# Patient Record
Sex: Male | Born: 1965 | Race: White | Hispanic: No | Smoking: Current every day smoker
Health system: Southern US, Community
[De-identification: ages and names within clinical notes are randomized; demographics above are authoritative.]

## PROBLEM LIST (undated history)

## (undated) HISTORY — PX: TONSILLECTOMY: SUR1361

## (undated) HISTORY — PX: ABDOMINAL SURGERY: SHX537

---

## 2013-09-18 ENCOUNTER — Encounter (HOSPITAL_COMMUNITY): Payer: Self-pay | Admitting: Emergency Medicine

## 2013-09-18 ENCOUNTER — Emergency Department (HOSPITAL_COMMUNITY)
Admission: EM | Admit: 2013-09-18 | Discharge: 2013-09-18 | Disposition: A | Discharge status: Home / Self Care | Payer: Self-pay | Attending: Emergency Medicine | Admitting: Emergency Medicine

## 2013-09-18 DIAGNOSIS — Z792 Long term (current) use of antibiotics: Secondary | ICD-10-CM | POA: Insufficient documentation

## 2013-09-18 DIAGNOSIS — K029 Dental caries, unspecified: Secondary | ICD-10-CM | POA: Insufficient documentation

## 2013-09-18 DIAGNOSIS — F17200 Nicotine dependence, unspecified, uncomplicated: Secondary | ICD-10-CM | POA: Insufficient documentation

## 2013-09-18 MED ORDER — OXYCODONE-ACETAMINOPHEN 5-325 MG PO TABS
1.0000 | ORAL_TABLET | Freq: Once | ORAL | Status: AC
  Administered 2013-09-18: 1 via ORAL
  Filled 2013-09-18: qty 1

## 2013-09-18 MED ORDER — CLINDAMYCIN HCL 300 MG PO CAPS: 300.0000 mg | ORAL_CAPSULE | Freq: Three times a day (TID) | ORAL | Status: DC

## 2013-09-18 MED ORDER — CLINDAMYCIN HCL 150 MG PO CAPS: 300.0000 mg | ORAL_CAPSULE | Freq: Three times a day (TID) | ORAL | Status: DC

## 2013-09-18 MED ORDER — HYDROCODONE-ACETAMINOPHEN 5-325 MG PO TABS
1.0000 | ORAL_TABLET | ORAL | Status: DC | PRN
Start: 2013-09-18 — End: 2014-08-24

## 2013-09-18 MED ORDER — CLINDAMYCIN PHOSPHATE 600 MG/50ML IV SOLN
600.0000 mg | Freq: Once | INTRAVENOUS | Status: AC
  Administered 2013-09-18: 600 mg via INTRAVENOUS
  Filled 2013-09-18: qty 50

## 2013-09-18 NOTE — ED Notes (Signed)
Pt from home reports tooth pain x4 days. Pt states that he woke up this am with jaw swelling and pain to the back of his throat. Pt is A&O and in NAD
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
## 2013-09-18 NOTE — Progress Notes (Signed)
Received an incoming call from [facility] re Patients clindamycin prescription.Pharmacists requesting if medication dose can be changed from 300 mg to 150 mg as this will be cheaper for  The Patient.Ths Case Manager spoke with Pricilla Loveless MD. New order received from Dr Criss Alvine  For Clindamycin 2 capsules- 300mg - three times daily.Pharmacist christina accepted  Order.No further case manager needs identified.

## 2014-08-24 ENCOUNTER — Emergency Department (HOSPITAL_COMMUNITY): Payer: 59

## 2014-08-24 ENCOUNTER — Emergency Department (HOSPITAL_COMMUNITY)
Admission: EM | Admit: 2014-08-24 | Discharge: 2014-08-24 | Disposition: A | Discharge status: Home / Self Care | Payer: 59 | Attending: Emergency Medicine | Admitting: Emergency Medicine

## 2014-08-24 ENCOUNTER — Encounter (HOSPITAL_COMMUNITY): Payer: Self-pay | Admitting: Emergency Medicine

## 2014-08-24 DIAGNOSIS — R1011 Right upper quadrant pain: Secondary | ICD-10-CM | POA: Diagnosis present

## 2014-08-24 DIAGNOSIS — Z9889 Other specified postprocedural states: Secondary | ICD-10-CM | POA: Insufficient documentation

## 2014-08-24 DIAGNOSIS — Z720 Tobacco use: Secondary | ICD-10-CM | POA: Diagnosis not present

## 2014-08-24 DIAGNOSIS — R0602 Shortness of breath: Secondary | ICD-10-CM | POA: Diagnosis not present

## 2014-08-24 DIAGNOSIS — K219 Gastro-esophageal reflux disease without esophagitis: Secondary | ICD-10-CM | POA: Diagnosis not present

## 2014-08-24 DIAGNOSIS — R079 Chest pain, unspecified: Secondary | ICD-10-CM | POA: Insufficient documentation

## 2014-08-24 DIAGNOSIS — R1010 Upper abdominal pain, unspecified: Secondary | ICD-10-CM

## 2014-08-24 LAB — CBC WITH DIFFERENTIAL/PLATELET
BASOS PCT: 0 % (ref 0–1)
Basophils Absolute: 0 10*3/uL (ref 0.0–0.1)
EOS ABS: 0.2 10*3/uL (ref 0.0–0.7)
Eosinophils Relative: 3 % (ref 0–5)
HCT: 42.1 % (ref 39.0–52.0)
Hemoglobin: 14.9 g/dL (ref 13.0–17.0)
Lymphocytes Relative: 21 % (ref 12–46)
Lymphs Abs: 1.3 10*3/uL (ref 0.7–4.0)
MCH: 31 pg (ref 26.0–34.0)
MCHC: 35.4 g/dL (ref 30.0–36.0)
MCV: 87.5 fL (ref 78.0–100.0)
Monocytes Absolute: 0.6 10*3/uL (ref 0.1–1.0)
Monocytes Relative: 9 % (ref 3–12)
NEUTROS ABS: 4.3 10*3/uL (ref 1.7–7.7)
Neutrophils Relative %: 67 % (ref 43–77)
PLATELETS: 200 10*3/uL (ref 150–400)
RBC: 4.81 MIL/uL (ref 4.22–5.81)
RDW: 13.4 % (ref 11.5–15.5)
WBC: 6.5 10*3/uL (ref 4.0–10.5)

## 2014-08-24 LAB — COMPREHENSIVE METABOLIC PANEL
ALBUMIN: 3.8 g/dL (ref 3.5–5.2)
ALK PHOS: 59 U/L (ref 39–117)
ALT: 14 U/L (ref 0–53)
AST: 23 U/L (ref 0–37)
Anion gap: 13 (ref 5–15)
BUN: 17 mg/dL (ref 6–23)
CHLORIDE: 99 meq/L (ref 96–112)
CO2: 25 mEq/L (ref 19–32)
Calcium: 9.5 mg/dL (ref 8.4–10.5)
Creatinine, Ser: 0.84 mg/dL (ref 0.50–1.35)
GFR calc Af Amer: >90 mL/min (ref >90)
GFR calc non Af Amer: >90 mL/min (ref >90)
Glucose, Bld: 113 mg/dL — ABNORMAL HIGH (ref 70–99)
POTASSIUM: 4.5 meq/L (ref 3.7–5.3)
SODIUM: 137 meq/L (ref 137–147)
TOTAL PROTEIN: 7.1 g/dL (ref 6.0–8.3)
Total Bilirubin: 0.5 mg/dL (ref 0.3–1.2)

## 2014-08-24 LAB — URINALYSIS, ROUTINE W REFLEX MICROSCOPIC
Bilirubin Urine: NEGATIVE
GLUCOSE, UA: NEGATIVE mg/dL
HGB URINE DIPSTICK: NEGATIVE
Ketones, ur: NEGATIVE mg/dL
Leukocytes, UA: NEGATIVE
Nitrite: NEGATIVE
Protein, ur: NEGATIVE mg/dL
SPECIFIC GRAVITY, URINE: 1.02 (ref 1.005–1.030)
Urobilinogen, UA: 0.2 mg/dL (ref 0.0–1.0)
pH: 6 (ref 5.0–8.0)

## 2014-08-24 LAB — TROPONIN I: Troponin I: <0.3 ng/mL (ref <0.30)

## 2014-08-24 LAB — LIPASE, BLOOD: Lipase: 49 U/L (ref 11–59)

## 2014-08-24 MED ORDER — PANTOPRAZOLE SODIUM 40 MG IV SOLR
40.0000 mg | Freq: Once | INTRAVENOUS | Status: AC
  Administered 2014-08-24: 40 mg via INTRAVENOUS
  Filled 2014-08-24: qty 40

## 2014-08-24 MED ORDER — PANTOPRAZOLE SODIUM 20 MG PO TBEC: 20.0000 mg | DELAYED_RELEASE_TABLET | Freq: Every day | ORAL | Status: AC

## 2014-08-24 MED ORDER — SODIUM CHLORIDE 0.9 % IV BOLUS (SEPSIS)
1000.0000 mL | Freq: Once | INTRAVENOUS | Status: AC
  Administered 2014-08-24: 1000 mL via INTRAVENOUS
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
## 2014-08-24 NOTE — ED Notes (Signed)
Gave patient a diet coke and he handle it well

## 2014-08-24 NOTE — ED Notes (Signed)
Pt states he is having pain in his upper abdomen above his umbilicus that started last night about 2030  Pt has nausea without vomiting or diarrhea  Pt states the pain has subsided some but it very tender to palpate  Pt states the pain comes in waves

## 2014-08-24 NOTE — Discharge Instructions (Signed)
Please call your doctor for a followup appointment within 24-48 hours. When you talk to your doctor please let them know that you were seen in the emergency department and have them acquire all of your records so that they can discuss the findings with you and formulate a treatment plan to fully care for your new and ongoing problems. Cardiology is to call you at some point next week - please follow-up with them Please rest and stay hydrated Please avoid any Gastroesophageal Reflux Disease, Adult Gastroesophageal reflux disease (GERD) happens when acid from your stomach flows up into the esophagus. When acid comes in contact with the esophagus, the acid causes soreness (inflammation) in the esophagus. Over time, GERD may create small holes (ulcers) in the lining of the esophagus. CAUSES   Increased body weight. This puts pressure on the stomach, making acid rise from the stomach into the esophagus.  Smoking. This increases acid production in the stomach.  Drinking alcohol. This causes decreased pressure in the lower esophageal sphincter (valve or ring of muscle between the esophagus and stomach), allowing acid from the stomach into the esophagus.  Late evening meals and a full stomach. This increases pressure and acid production in the stomach.  A malformed lower esophageal sphincter. Sometimes, no cause is found. SYMPTOMS   Burning pain in the lower part of the mid-chest behind the breastbone and in the mid-stomach area. This may occur twice a week or more often.  Trouble swallowing.  Sore throat.  Dry cough.  Asthma-like symptoms including chest tightness, shortness of breath, or wheezing. DIAGNOSIS  Your caregiver may be able to diagnose GERD based on your symptoms. In some cases, X-rays and other tests may be done to check for complications or to check the condition of your stomach and esophagus. TREATMENT  Your caregiver may recommend over-the-counter or prescription medicines to  help decrease acid production. Ask your caregiver before starting or adding any new medicines.  HOME CARE INSTRUCTIONS   Change the factors that you can control. Ask your caregiver for guidance concerning weight loss, quitting smoking, and alcohol consumption.  Avoid foods and drinks that make your symptoms worse, such as:  Caffeine or alcoholic drinks.  Chocolate.  Peppermint or mint flavorings.  Garlic and onions.  Spicy foods.  Citrus fruits, such as oranges, lemons, or limes.  Tomato-based foods such as sauce, chili, salsa, and pizza.  Fried and fatty foods.  Avoid lying down for the 3 hours prior to your bedtime or prior to taking a nap.  Eat small, frequent meals instead of large meals.  Wear loose-fitting clothing. Do not wear anything tight around your waist that causes pressure on your stomach.  Raise the head of your bed 6 to 8 inches with wood blocks to help you sleep. Extra pillows will not help.  Only take over-the-counter or prescription medicines for pain, discomfort, or fever as directed by your caregiver.  Do not take aspirin, ibuprofen, or other nonsteroidal anti-inflammatory drugs (NSAIDs). SEEK IMMEDIATE MEDICAL CARE IF:   You have pain in your arms, neck, jaw, teeth, or back.  Your pain increases or changes in intensity or duration.  You develop nausea, vomiting, or sweating (diaphoresis).  You develop shortness of breath, or you faint.  Your vomit is green, yellow, black, or looks like coffee grounds or blood.  Your stool is red, bloody, or black. These symptoms could be signs of other problems, such as heart disease, gastric bleeding, or esophageal bleeding. MAKE SURE YOU:  Understand these instructions.  Will watch your condition.  Will get help right away if you are not doing well or get worse. Document Released: 08/14/2005 Document Revised: 01/27/2012 Document Reviewed: 05/24/2011 [facility] Patient Information 2015 [place], [place].  This information is not intended to replace advice given to you by your health care provider. Make sure you discuss any questions you have with your health care provider. physical or strenuous activity  Please rest and stay hydrated Please decrease alcohol, fatty foods, greasy foods, spicy foods Please take medications as prescribed Please continue to monitor symptoms closely and if symptoms are to worsen or change (fever greater than 101, chills, sweating, nausea, vomiting, chest pain, shortness of breathe, difficulty breathing, weakness, numbness, tingling, worsening or changes to pain pattern, blood in the stools, black tarry stools) please report back to the Emergency Department immediately.   Food Choices for Gastroesophageal Reflux Disease When you have gastroesophageal reflux disease (GERD), the foods you eat and your eating habits are very important. Choosing the right foods can help ease the discomfort of GERD. WHAT GENERAL GUIDELINES DO I NEED TO FOLLOW?  Choose fruits, vegetables, whole grains, low-fat dairy products, and low-fat meat, fish, and poultry.  Limit fats such as oils, salad dressings, butter, nuts, and avocado.  Keep a food diary to identify foods that cause symptoms.  Avoid foods that cause reflux. These may be different for different people.  Eat frequent small meals instead of three large meals each day.  Eat your meals slowly, in a relaxed setting.  Limit fried foods.  Cook foods using methods other than frying.  Avoid drinking alcohol.  Avoid drinking large amounts of liquids with your meals.  Avoid bending over or lying down until 2-3 hours after eating. WHAT FOODS ARE NOT RECOMMENDED? The following are some foods and drinks that may worsen your symptoms: Vegetables Tomatoes. Tomato juice. Tomato and spaghetti sauce. Chili peppers. Onion and garlic. Horseradish. Fruits Oranges, grapefruit, and lemon (fruit and juice). Meats High-fat meats, fish, and  poultry. This includes hot dogs, ribs, ham, sausage, salami, and bacon. Dairy Whole milk and chocolate milk. Sour cream. Cream. Butter. Ice cream. Cream cheese.  Beverages Coffee and tea, with or without caffeine. Carbonated beverages or energy drinks. Condiments Hot sauce. Barbecue sauce.  Sweets/Desserts Chocolate and cocoa. Donuts. Peppermint and spearmint. Fats and Oils High-fat foods, including [place] fries and potato chips. Other Vinegar. Strong spices, such as black pepper, white pepper, red pepper, cayenne, curry powder, cloves, ginger, and chili powder. The items listed above may not be a complete list of foods and beverages to avoid. Contact your dietitian for more information. Document Released: 11/04/2005 Document Revised: 11/09/2013 Document Reviewed: 09/08/2013 [facility] Patient Information 2015 [place], [place]. This information is not intended to replace advice given to you by your health care provider. Make sure you discuss any questions you have with your health care provider.   Emergency Department Resource Guide 1) Find a Doctor and Pay Out of Pocket Although you won't have to find out who is covered by your insurance plan, it is a good idea to ask around and get recommendations. You will then need to call the office and see if the doctor you have chosen will accept you as a new patient and what types of options they offer for patients who are self-pay. Some doctors offer discounts or will set up payment plans for their patients who do not have insurance, but you will need to ask so you aren't surprised when you  get to your appointment.  2) Contact Your Local Health Department Not all health departments have doctors that can see patients for sick visits, but many do, so it is worth a call to see if yours does. If you don't know where your local health department is, you can check in your phone book. The CDC also has a tool to help you locate your state's health department,  and many state websites also have listings of all of their local health departments.  3) Find a Walk-in Clinic If your illness is not likely to be very severe or complicated, you may want to try a walk in clinic. These are popping up all over the country in pharmacies, drugstores, and shopping centers. They're usually staffed by nurse practitioners or physician assistants that have been trained to treat common illnesses and complaints. They're usually fairly quick and inexpensive. However, if you have serious medical issues or chronic medical problems, these are probably not your best option.  No Primary Care Doctor: - Call Health Connect at  [phone] - they can help you locate a primary care doctor that  accepts your insurance, provides certain services, etc. - Physician Referral Service- [phone]  Chronic Pain Problems: Organization         Address  Phone   Notes  [facility]  [phone] Patients need to be referred by their primary care doctor.   Medication Assistance: Organization         Address  Phone   Notes  [facility] Medication [facility] [address] [place] [place], [place] [address] [phone] --Must be a resident of [facility] -- Must have NO insurance coverage whatsoever (no Medicaid/ Medicare, etc.) -- The pt. MUST have a primary care doctor that directs their care regularly and follows them in the community   [facility]  [phone]   [employer]  [phone]    Agencies that provide inexpensive medical care: Organization         Address  Phone   Notes  [facility]  [phone]   [facility]    [phone]   [facility] [address] [place], [place] [address] [phone]   [facility] [address] [place], [place] [phone]   Planned Parenthood    [phone]   [facility]    [phone]   Community  Health and [facility]  [address], [place] Phone:  [phone], Fax:  [phone] Hours of Operation:  9 am - 6 pm, M-F.  Also accepts Medicaid/Medicare and self-pay.  [facility]  [address], [place] Phone: [phone], Fax: [phone]. Hours of Operation:  8:30 am - 5:30 pm, M-F.  Also accepts Medicaid and self-pay.  [facility] [place] [address] [place] Phone: [phone]   [facility] [address] Natasha Bence [place], [place] [phone], Ext. [phone] Mondays & Thursdays: 7-9 AM.  First 15 patients are seen on a first come, first serve basis.    Medicaid-accepting [facility] Providers:  Organization         Address  Phone   Notes  [facility] [address], [place] [phone] Also accepts self-pay patients.  [facility] [address] Laurell Josephs [place], [place]  [phone]   [facility]  [address], [place] [phone]   [facility] [address], [place] [phone]   Renaye Rakers [address], [place]   [phone] Only accepts [place] patients after they have their name applied to their card.   Self-Pay (no insurance) in [facility]:  Organization         Address  Phone   Notes  Sickle Cell Patients, [facility] [address] [place], [place] [phone]   [facility] [address] [place], [place] [phone]   [facility] [address]  [address], [place] [phone]   [facility]/Dr. Osei-Bonsu  [address], [place] or [address], [place] [phone] Phone number for both [place] and [place] locations is the same.  [facility]gent [facility] and [facility] [address], [place] [phone]   [facility] [address], [place] or [address] [phone]   [facility] [address] [place] [phone], phone; [phone], fax Sees patients 1st and 3rd Saturday of every month.  Must not qualify for public or private insurance (i.e. Medicaid, Medicare, [place] Health Choice, Veterans' Benefits)  Household income should be no more than 200% of the poverty level The clinic cannot treat you if you are pregnant or think you are pregnant  Sexually transmitted diseases are not treated at the clinic.    Dental Care: Organization         Address  Phone  Notes  [facility] [address] [place], [place] [phone] Accepts children up to age 46 who are enrolled in [place] or [place] Health Choice; pregnant women with a Medicaid card; and children who have applied for Medicaid or [place] Health Choice, but were declined, whose parents can pay a reduced fee at time of service.  [facility] Department of [facility]  [address], [place] [phone] Accepts children up to age 11 who are enrolled in [place] or [place] Health Choice; pregnant women with a Medicaid card; and children who have applied for Medicaid or [place] Health Choice, but were declined, whose parents can pay a reduced fee at time of service.  [facility]  [address] [place], [place] [phone] Patients are seen by appointment only. Walk-ins are not accepted. [facility] will see patients 73 years of age and older. Monday - Tuesday (8am-5pm) Most Wednesdays (8:30-5pm) $30 per visit, cash only  [facility]  [address], [facility] [phone] Patients are seen by appointment only. Walk-ins are not accepted. [facility] will see patients 64 years of age and older. One Wednesday Evening (Monthly: Volunteer Based).  $30 per visit,  cash only  [employer]  [phone] for adults; Children under age 74, call Graduate Pediatric Dentistry at [phone]. Children aged 77-14, please call [phone] to request a pediatric application.  Dental services are provided in all areas of dental care including fillings, crowns and bridges, complete and partial dentures, implants, gum treatment, root canals, and extractions. Preventive care is also provided. Treatment is provided to both adults and children. Patients are selected via a lottery and there is often a waiting list.   [facility] [address], [place]  [phone] [email or web address]   [facility] [address], [place], [place] [phone], Ext. [phone] Second and Fourth Thursday of each month, opens at 6:30 AM; Clinic ends at 9 AM.  Patients are seen on a first-come first-served basis, and a limited number are seen during each clinic.   [facility]  [address], [place], [place] [phone]   Eligibility Requirements You must have lived in [place], [place], or [place] counties for at least the last three months.   You cannot be eligible for state or federal sponsored [place]healthcare insurance, including CIGNAVeterans Administration, [place]icaid, or [employer]icare.   You generally cannot be eligible for healthcare insurance through your employer.    How to apply: Eligibility screenings are held every Tuesday and Wednesday afternoon from 1:00 pm until 4:00 pm. You do not need an appointment for the interview!  [facility] [address], [place], [place] [phone]   [facility]  [phone]   [facility]  [phone]   [facility]  [phone]    Behavioral Health Resources in the Community: Intensive Outpatient Programs Organization         Address  Phone  Notes  [facility] [address],  [place], [place] [phone]   [facility] [address], [place], [place] [phone]   [facility] [address], [place], [place]  [phone]   [facility] [address],  [place], [place] [phone] or [phone]   Substance Abuse Resources Organization         Address  Phone  Notes  [facility]  [phone]   [facility]  [phone]   [facility]  [phone]   [facility]  [phone]   Residential & Outpatient Substance Abuse Program  [phone]   Psychological Services Organization         Address  Phone  Notes  [facility]  [phone]- [phone]   [facility]  [phone]- [phone]   [facility] [address], [place] [phone] or [phone]    Mobile Crisis Teams Organization         Address  Phone  Notes  [facility], Mobile Crisis Care Unit  [phone]   [facility]  [address] [place], [place] [phone]   Doristine LocksSharon DeEsch [address] [place] [phone]    Self-Help/Support Groups Organization         Address  Phone             Notes  [facility] [place] - variety of support groups  [phone] Call for more information  Narcotics Anonymous (NA), [facility]aring [facility] [address], [place]  2 meetings at this location   Statisticianesidential Treatment Programs Organization         Address  Phone  Notes  [facility] [address],    [place]  [phone]   [facility]  [address], [place] [phone], [place], [place] [phone]   [facility] [address], [place] [phone] Admissions: 8am-3pm M-F  [facility] [address]    [place], [place] [phone]   [facility] [address], [place], [place] [phone]   [facility] [address]  [place], [place]   [facility] - Intensive Outpatient [address], [place], [place]   [facility] [address]  [place], [place] [phone] or [phone]   [facility] ([facility]) [address] [place], [place] Accepts Medicaid  [facility] Sevier [address],  [place] [phone] Substance Abuse/Addiction Treatment   [facility] Organization         Address  Phone  Notes  [facility]  [phone]   Domenic Schwab, PhD [address] Arlis Porta [place], [place]   [phone] or [phone]   [place], [place] [phone]   [facility] [address], [place], [place] [phone] Insurance/Medicaid/sponsorship through [facility] and Families [address] [place]                                    [place], [place] [phone] [place] [address], [place] [phone]    Dr. Adele Schilder  [phone]   [facility] Dept. 1) [address] [employer] 2) [place] 3)  [address], [place] [phone]  [phone]   [place] [phone] or [phone] (After Hours)

## 2014-08-26 NOTE — ED Provider Notes (Signed)
Medical screening examination/treatment/procedure(s) were performed by non-physician practitioner and as supervising physician I was immediately available for consultation/collaboration.   EKG Interpretation   Date/Time:  Wednesday August 24 2014 03:33:59 EDT Ventricular Rate:  69 PR Interval:  164 QRS Duration: 87 QT Interval:  396 QTC Calculation: 424 R Axis:   80 Text Interpretation:  Sinus rhythm ED PHYSICIAN INTERPRETATION AVAILABLE  IN [facility] Confirmed by TEST, Record ([id number]) on 08/26/2014 6:57:32  AM       Derwood KaplanAnkit Tiffine Henigan, MD 08/26/14 1019

## 2014-11-07 ENCOUNTER — Other Ambulatory Visit: Payer: Self-pay | Admitting: Family Medicine

## 2014-11-07 ENCOUNTER — Ambulatory Visit
Admission: RE | Admit: 2014-11-07 | Discharge: 2014-11-07 | Disposition: A | Discharge status: Home / Self Care | Payer: 59 | Source: Ambulatory Visit | Attending: Family Medicine | Admitting: Family Medicine

## 2014-11-07 DIAGNOSIS — M545 Low back pain, unspecified: Secondary | ICD-10-CM

## 2014-11-07 DIAGNOSIS — R52 Pain, unspecified: Secondary | ICD-10-CM

## 2015-07-01 IMAGING — CR DG ABDOMEN ACUTE W/ 1V CHEST
3 series · 3 of 3 positions shown · non-contrast
Comparison: None.

CLINICAL DATA: Mid abdominal pain and nausea since last night,
smoker, cough

EXAM:
ACUTE ABDOMEN SERIES (ABDOMEN 2 VIEW & CHEST 1 VIEW)

[w chest pa]
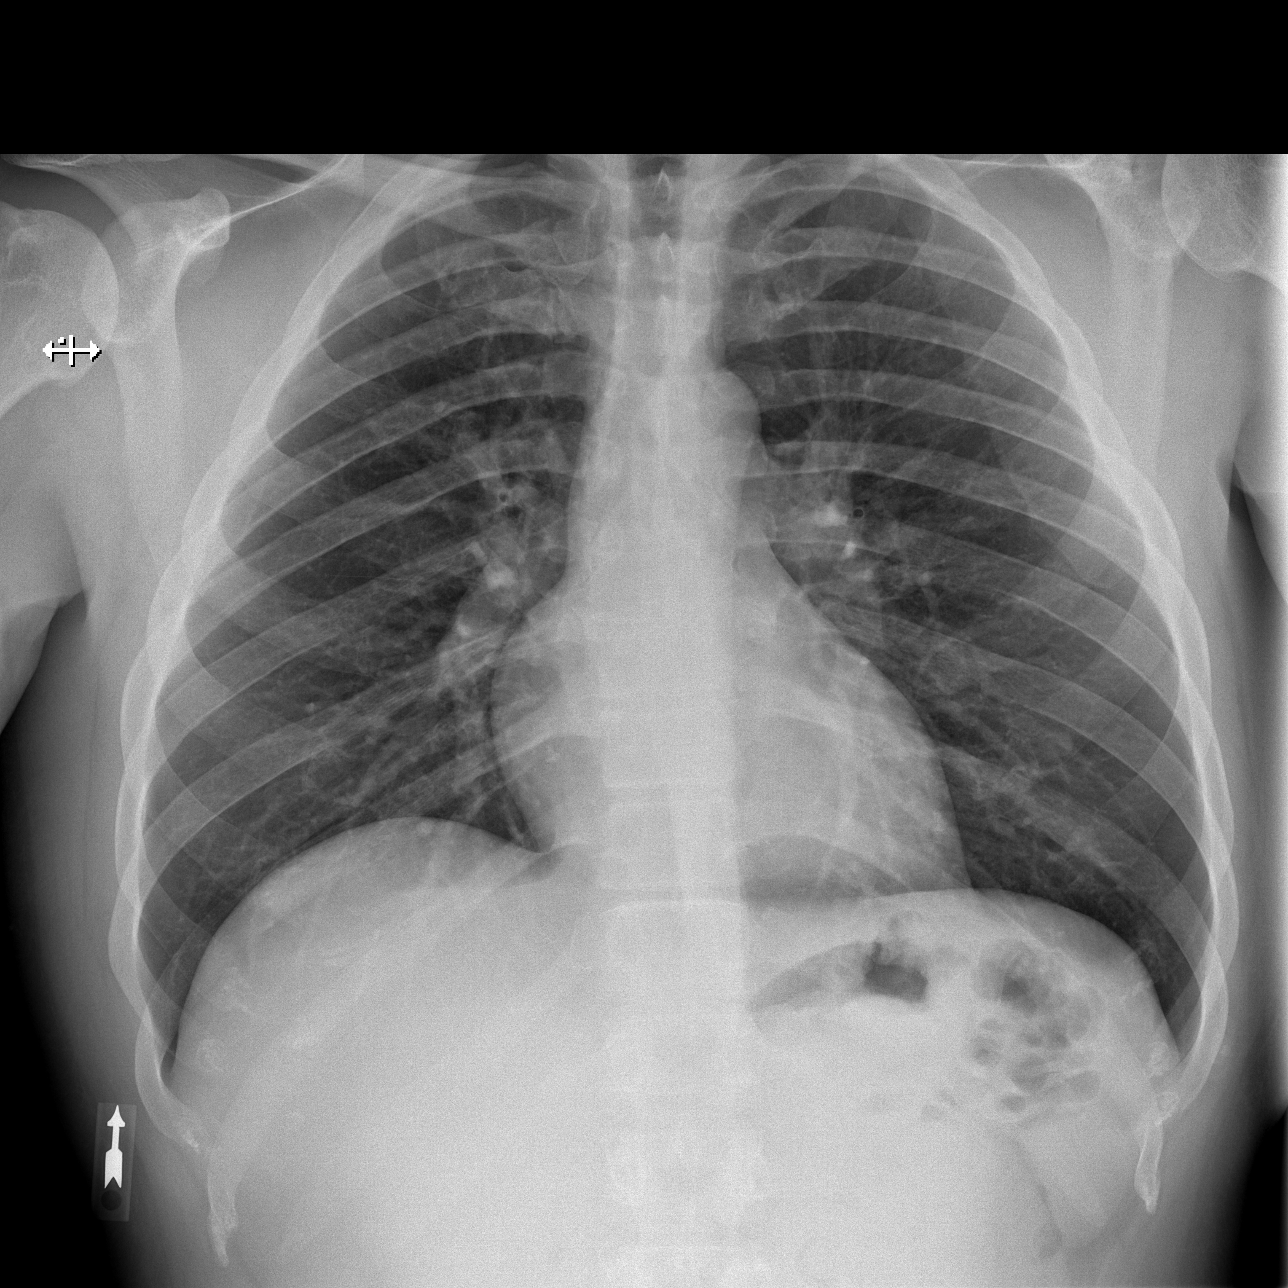

[w abdomen upright]
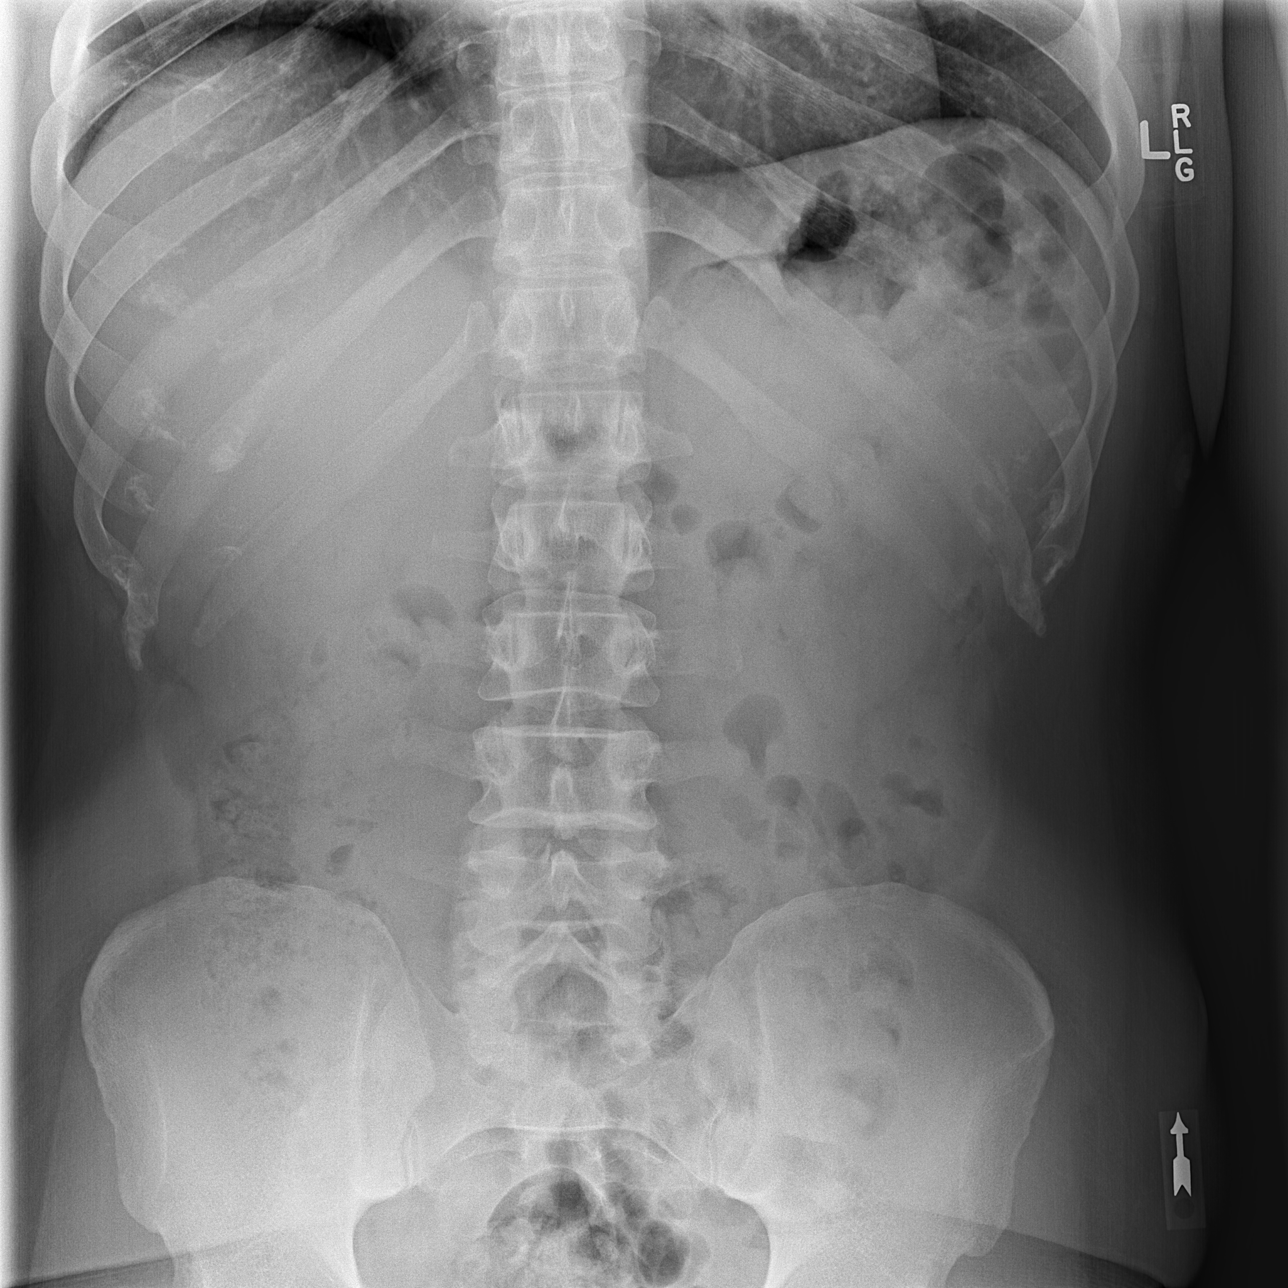

[t abdomen supine]
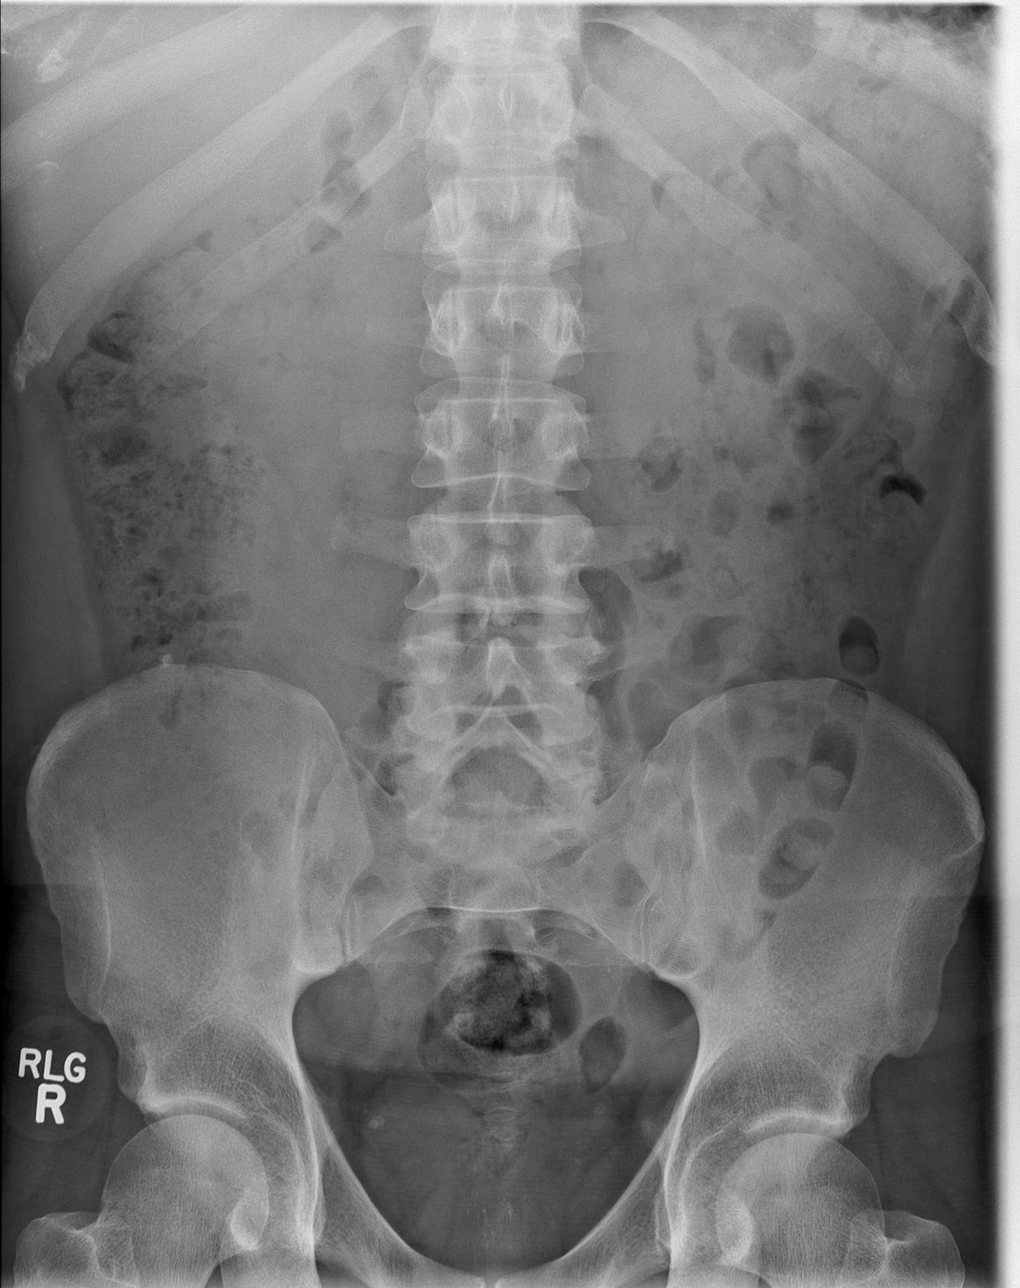

[3 of 3 positions shown; findings below may reference images not displayed]

FINDINGS: Normal heart size, mediastinal contours, and pulmonary vascularity.

Bronchitic changes without infiltrate, pleural effusion or
pneumothorax.

Normal bowel gas pattern.

No bowel dilatation or bowel wall thickening or free intraperitoneal
air.

Normal stool throughout colon.

Bones unremarkable.

No urinary tract calcification.
IMPRESSION: Bronchitic changes.

No acute abdominal findings.

## 2015-09-14 IMAGING — CR DG CERVICAL SPINE COMPLETE 4+V
7 series · 7 of 7 positions shown · non-contrast
Comparison: None.

CLINICAL DATA: Left arm and bilateral hand numbness for 2 months.
Heavy lifting at work. No recent trauma. Initial encounter.

EXAM:
CERVICAL SPINE  4+ VIEWS

[view not recorded (1 of 7)]
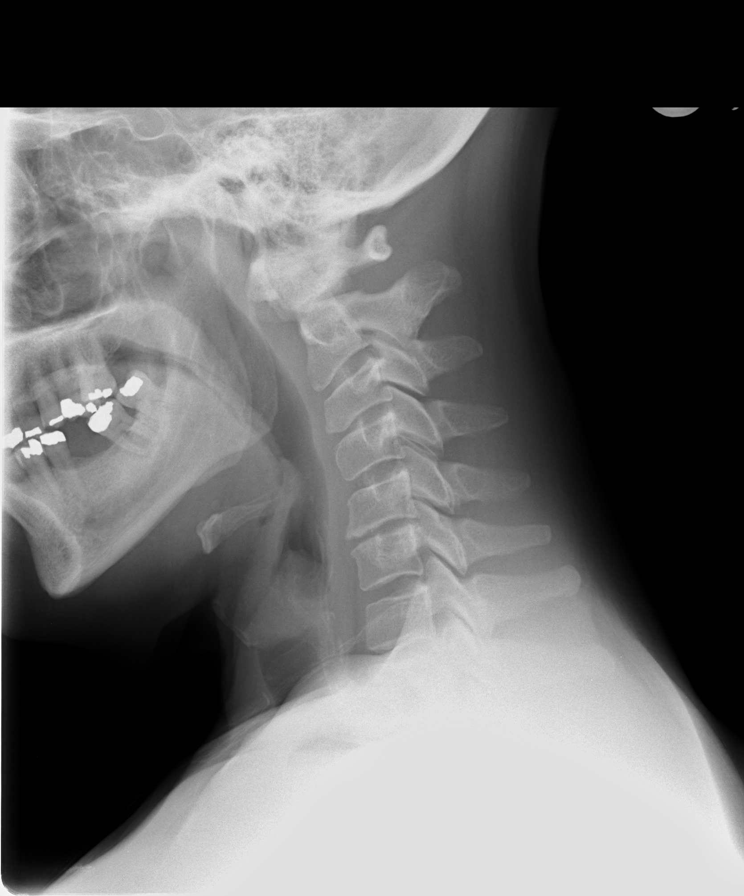

[view not recorded (2 of 7)]
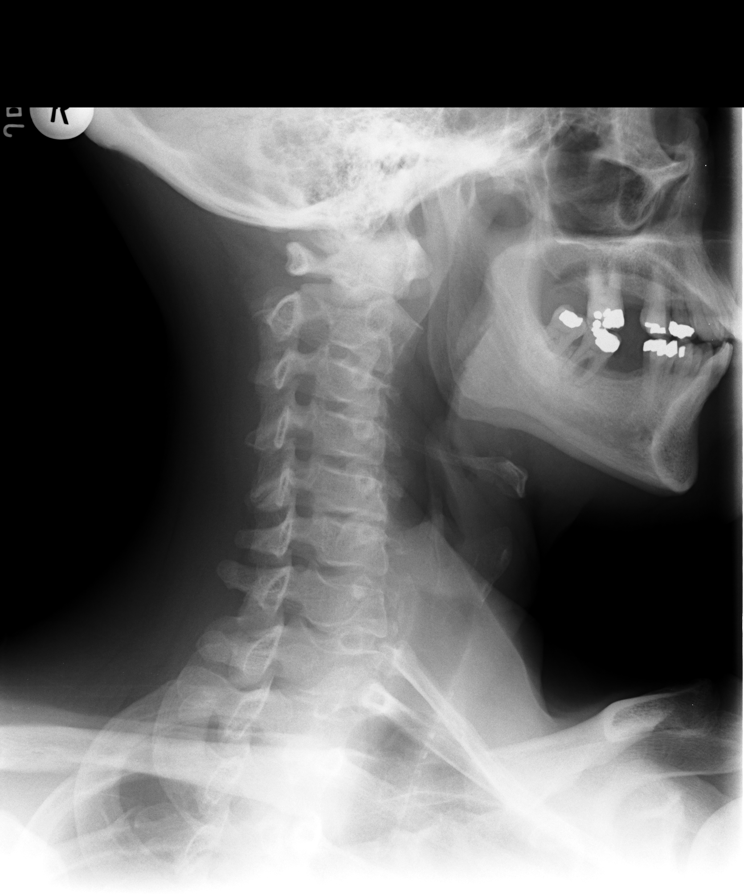

[view not recorded (3 of 7)]
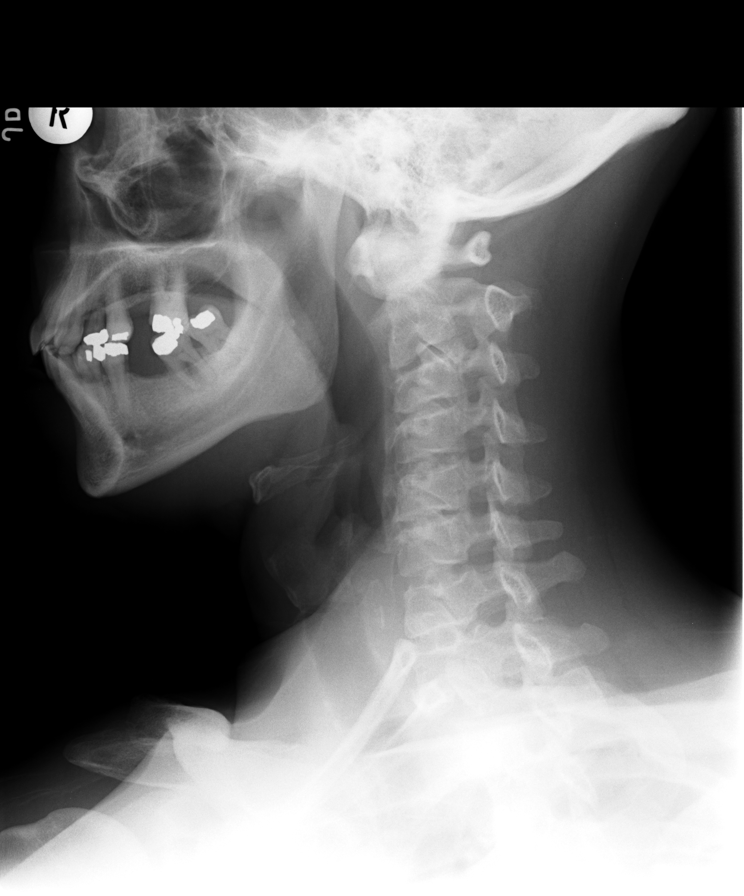

[view not recorded (4 of 7)]
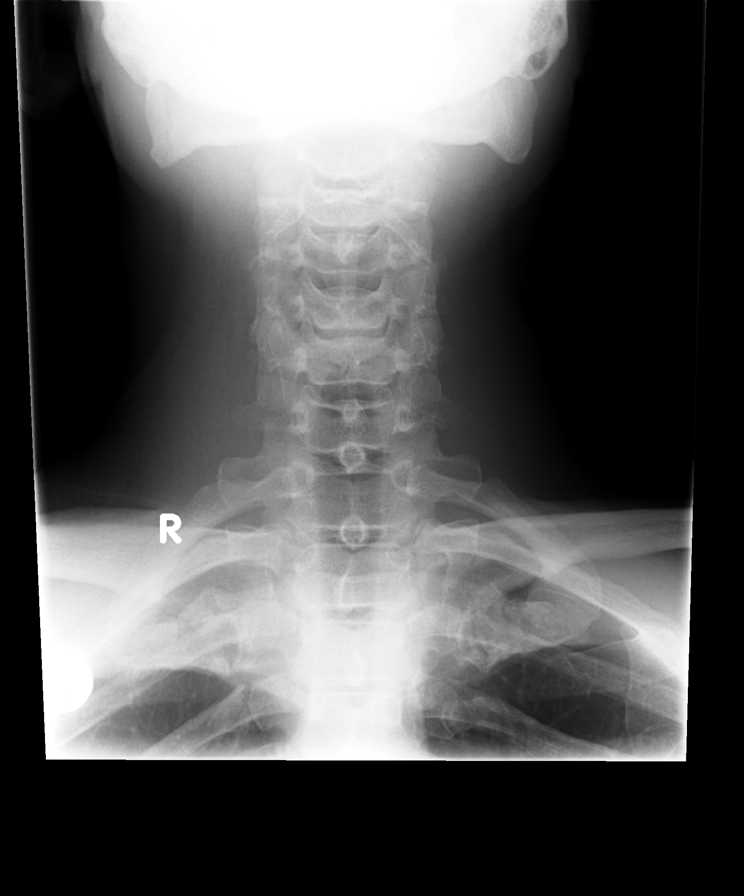

[view not recorded (5 of 7)]
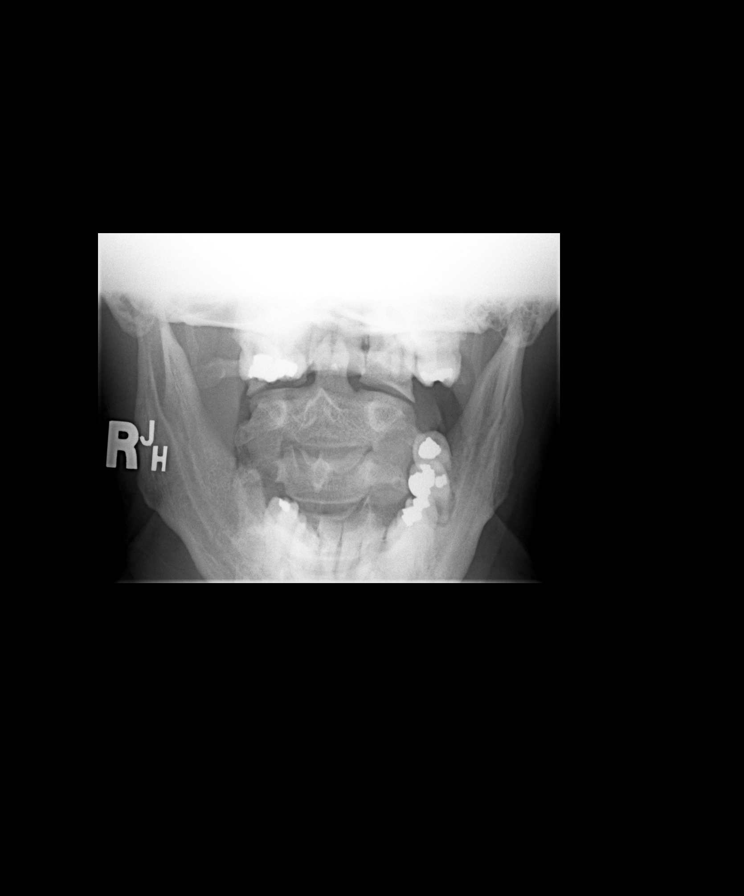

[view not recorded (6 of 7)]
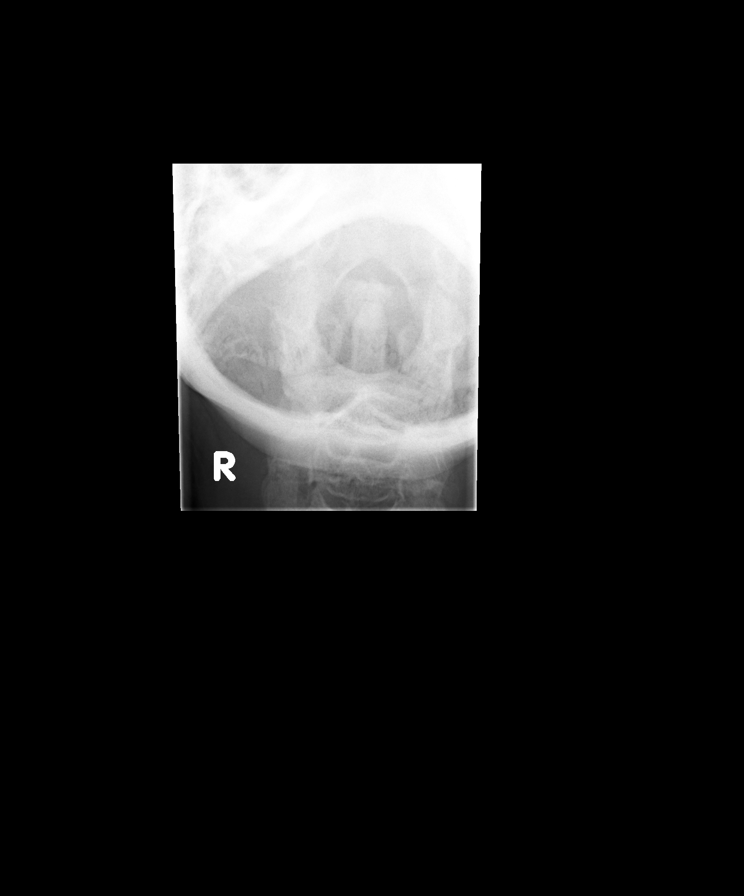

[view not recorded (7 of 7)]
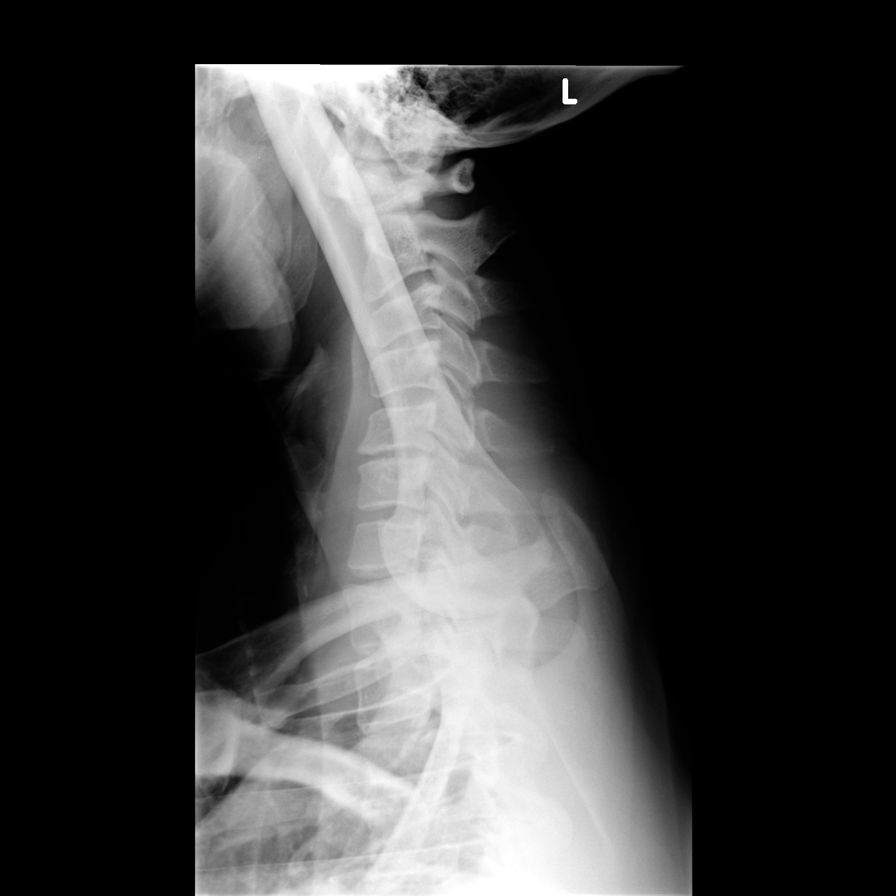

[7 of 7 positions shown; findings below may reference images not displayed]

FINDINGS: There is an upper cervical kyphosis without focal angulation or
listhesis. The C1-2 articulation appears normal on the AP
projection. There is disc space loss anteriorly at C3-4 and more
diffusely at C5-6. Oblique views demonstrate no significant osseous
foraminal stenosis. There is no evidence of acute fracture.
IMPRESSION: Upper cervical kyphosis with spondylosis as described, most advanced
at C5-6. No acute osseous findings demonstrated.

## 2021-08-18 DEATH — deceased
